# Patient Record
Sex: Female | Born: 1990 | Race: Black or African American | Hispanic: No | Marital: Single | Smoking: Never smoker
Health system: Southern US, Community
[De-identification: ages and names within clinical notes are randomized; demographics above are authoritative.]

## PROBLEM LIST (undated history)

## (undated) DIAGNOSIS — J45909 Unspecified asthma, uncomplicated: Secondary | ICD-10-CM

## (undated) DIAGNOSIS — K8020 Calculus of gallbladder without cholecystitis without obstruction: Secondary | ICD-10-CM

## (undated) HISTORY — PX: KNEE ARTHROSCOPY W/ MEDIAL COLLATERAL LIGAMENT (MCL) REPAIR: SHX1876

## (undated) HISTORY — PX: ANTERIOR CRUCIATE LIGAMENT REPAIR: SHX115

---
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
## 2012-08-05 ENCOUNTER — Encounter (HOSPITAL_COMMUNITY): Payer: Self-pay | Admitting: Emergency Medicine

## 2012-08-05 ENCOUNTER — Emergency Department (HOSPITAL_COMMUNITY): Payer: BC Managed Care – PPO

## 2012-08-05 ENCOUNTER — Emergency Department (HOSPITAL_COMMUNITY)
Admission: EM | Admit: 2012-08-05 | Discharge: 2012-08-05 | Disposition: A | Discharge status: Home / Self Care | Payer: BC Managed Care – PPO | Attending: Emergency Medicine | Admitting: Emergency Medicine

## 2012-08-05 DIAGNOSIS — X500XXA Overexertion from strenuous movement or load, initial encounter: Secondary | ICD-10-CM | POA: Insufficient documentation

## 2012-08-05 DIAGNOSIS — S92309A Fracture of unspecified metatarsal bone(s), unspecified foot, initial encounter for closed fracture: Secondary | ICD-10-CM | POA: Insufficient documentation

## 2012-08-05 DIAGNOSIS — M2540 Effusion, unspecified joint: Secondary | ICD-10-CM | POA: Insufficient documentation

## 2012-08-05 DIAGNOSIS — Y939 Activity, unspecified: Secondary | ICD-10-CM | POA: Insufficient documentation

## 2012-08-05 DIAGNOSIS — S92351S Displaced fracture of fifth metatarsal bone, right foot, sequela: Secondary | ICD-10-CM

## 2012-08-05 DIAGNOSIS — W172XXA Fall into hole, initial encounter: Secondary | ICD-10-CM | POA: Insufficient documentation

## 2012-08-05 DIAGNOSIS — S92209A Fracture of unspecified tarsal bone(s) of unspecified foot, initial encounter for closed fracture: Secondary | ICD-10-CM | POA: Insufficient documentation

## 2012-08-05 DIAGNOSIS — Y929 Unspecified place or not applicable: Secondary | ICD-10-CM | POA: Insufficient documentation

## 2012-08-05 MED ORDER — TRAMADOL HCL 50 MG PO TABS: 50.0000 mg | ORAL_TABLET | Freq: Four times a day (QID) | ORAL | Status: DC | PRN

## 2012-08-05 MED ORDER — IBUPROFEN 800 MG PO TABS: 800.0000 mg | ORAL_TABLET | Freq: Three times a day (TID) | ORAL | Status: DC

## 2012-08-05 NOTE — ED Provider Notes (Signed)
Medical screening examination/treatment/procedure(s) were performed by non-physician practitioner and as supervising physician I was immediately available for consultation/collaboration.   Carleene Cooper III, MD 08/05/12 [phone]

## 2012-08-05 NOTE — ED Provider Notes (Signed)
History     CSN: [id number]  Arrival date & time 08/05/12  1056   First MD Initiated Contact with Patient 08/05/12 1133      Chief Complaint  Patient presents with  . Ankle Pain    (Consider location/radiation/quality/duration/timing/severity/associated sxs/prior treatment) HPI Comments: Pt presents to ED after falling into a pot hole and twisted her ankle last night.  She fell onto the lateral side of her right ankle and heard a crunching sound.  Went straight home and elevated her foot.  Has not been taking pain medications or icing her foot.  No prior injury to the ankle.  Denies any numbness, tingling, or loss of sensation into the right foot.  Is not able to bear weight.  Patient is a 22 y.o. female presenting with ankle pain. The history is provided by the patient.  Ankle Pain   History reviewed. No pertinent past medical history.  History reviewed. No pertinent past surgical history.  History reviewed. No pertinent family history.  History  Substance Use Topics  . Smoking status: Never Smoker   . Smokeless tobacco: Not on file  . Alcohol Use: Yes    OB History   Grav Para Term Preterm Abortions TAB SAB Ect Mult Living                  Review of Systems  Musculoskeletal: Positive for joint swelling.  All other systems reviewed and are negative.    Allergies  Review of patient's allergies indicates no known allergies.  Home Medications  No current outpatient prescriptions on file.  BP 129/75  Pulse 92  Temp(Src) 98.3 F (36.8 C) (Oral)  Resp 18  SpO2 99%  Physical Exam  Nursing note and vitals reviewed. Constitutional: She is oriented to person, place, and time. She appears well-developed and well-nourished.  HENT:  Head: Normocephalic and atraumatic.  Eyes: EOM are normal.  Neck: Normal range of motion.  Cardiovascular: Normal rate, regular rhythm and normal heart sounds.   Pulmonary/Chest: Effort normal and breath sounds normal.   Musculoskeletal:       Right ankle: She exhibits decreased range of motion (due to pain) and swelling. She exhibits no ecchymosis, no deformity, no laceration and normal pulse. Achilles tendon normal.  Maximum tenderness at base of 5th metatarsal; swelling along lateral right foot.  Normal sensation throughout  Neurological: She is alert and oriented to person, place, and time.  Skin: Skin is warm and dry.    ED Course  Procedures (including critical care time)  Labs Reviewed - No data to display Dg Ankle Complete Right  08/05/2012  *RADIOLOGY REPORT*  Clinical Data: Injured right ankle.  RIGHT ANKLE - COMPLETE 3+ VIEW  Comparison: None  Findings: The ankle mortise is maintained.  No acute ankle fracture or osteochondral lesion.  Multiple rounded densities between the talus and the lateral malleolus likely due to previous avulsion injuries.  The visualized mid and hind foot bony structures are intact. Acute versus chronic the avulsion injury at the base of the fifth metatarsal.  IMPRESSION:  1.  No acute ankle fracture. 2.  Acute versus chronic small avulsion fracture at the base of the fifth metatarsal.  Recommend clinical correlation.   Original Report Authenticated By: Rudie Meyer, M.D.      1. Jones fracture, right, sequela       MDM  12:16 PM Pt evaluated.  Small avulsion fx at base of 5th metatarsal, most likely acute.  Given post-op shoe, crutches and pain  meds. Given instructions to follow up with PCP if sx are not improving in the next few days.  Note given for modified duty at work to allow the foot to rest.  [employer] given if additional services are needed.  Return to ED for new or worsening symptoms.        Garlon Hatchet, PA-C 08/05/12 1428

## 2012-08-05 NOTE — Progress Notes (Signed)
Orthopedic Tech Progress Note Patient Details:  [place] Sprague 06/11/1991 [id number]  Ortho Devices Type of Ortho Device: Postop shoe/boot;Crutches Ortho Device/Splint Location: RIGHT POST OP SHOE AND CRUTCHES 5.10'' Ortho Device/Splint Interventions: Application   Cammer, Mickie Bail 08/05/2012, 12:46 PM

## 2012-08-05 NOTE — ED Notes (Signed)
Pt c/o right ankle pain after falling and twisting ankle last night

## 2013-07-23 ENCOUNTER — Emergency Department (HOSPITAL_COMMUNITY)
Admission: EM | Admit: 2013-07-23 | Discharge: 2013-07-24 | Disposition: A | Discharge status: Home / Self Care | Payer: BC Managed Care – PPO | Attending: Emergency Medicine | Admitting: Emergency Medicine

## 2013-07-23 ENCOUNTER — Encounter (HOSPITAL_COMMUNITY): Payer: Self-pay | Admitting: Emergency Medicine

## 2013-07-23 DIAGNOSIS — R5383 Other fatigue: Secondary | ICD-10-CM

## 2013-07-23 DIAGNOSIS — R112 Nausea with vomiting, unspecified: Secondary | ICD-10-CM | POA: Insufficient documentation

## 2013-07-23 DIAGNOSIS — R824 Acetonuria: Secondary | ICD-10-CM

## 2013-07-23 DIAGNOSIS — R6889 Other general symptoms and signs: Secondary | ICD-10-CM

## 2013-07-23 DIAGNOSIS — R5381 Other malaise: Secondary | ICD-10-CM | POA: Insufficient documentation

## 2013-07-23 DIAGNOSIS — K5900 Constipation, unspecified: Secondary | ICD-10-CM

## 2013-07-23 DIAGNOSIS — R3919 Other difficulties with micturition: Secondary | ICD-10-CM | POA: Insufficient documentation

## 2013-07-23 DIAGNOSIS — R6883 Chills (without fever): Secondary | ICD-10-CM | POA: Insufficient documentation

## 2013-07-23 DIAGNOSIS — R079 Chest pain, unspecified: Secondary | ICD-10-CM | POA: Insufficient documentation

## 2013-07-23 MED ORDER — ONDANSETRON 8 MG PO TBDP
8.0000 mg | ORAL_TABLET | Freq: Once | ORAL | Status: AC
  Administered 2013-07-23: 8 mg via ORAL
  Filled 2013-07-23: qty 1

## 2013-07-23 MED ORDER — BISACODYL 5 MG PO TBEC
5.0000 mg | DELAYED_RELEASE_TABLET | Freq: Every day | ORAL | Status: DC | PRN
  Administered 2013-07-23: 5 mg via ORAL
  Filled 2013-07-23: qty 1

## 2013-07-23 NOTE — ED Notes (Signed)
Lab called stating urine had been mislabeled. Pt aware of need for another urine sample.

## 2013-07-23 NOTE — ED Notes (Signed)
Bed: [id number] Expected date:  Expected time:  Means of arrival:  Comments: EMS gen weakness

## 2013-07-23 NOTE — ED Notes (Signed)
Pt reports has not had BM for at least a week. She reports has only been eating 1 meal a day, hasn't had much appetite. Pt says she has been drinking water. C/o dark colored urine, denies dysuria or any other urinary sx.

## 2013-07-23 NOTE — ED Notes (Signed)
Per EMS: Pt reports decreased appetite and constipation x 1 wk. Pt has been drinking water, not eating solid foods. Pt reports increased anxiety recently.

## 2013-07-23 NOTE — ED Provider Notes (Signed)
CSN: [id number]     Arrival date & time 07/23/13  2102 History   First MD Initiated Contact with Patient 07/23/13 2119     Chief Complaint  Patient presents with  . Fatigue  . Constipation   (Consider location/radiation/quality/duration/timing/severity/associated sxs/prior Treatment) HPI Patient states she took a long walk outside today in the cold and came home and was shivering and having a hard time warming up.  States she drank warm apple cider and vomited.  She does currently have nausea.  Denies abdominal pain.  States she has had many problems since October and November of 2014- states she is intermittently constipated and is constipated currently.  States her urine is dark and malodorous, she has had this checked by her doctor and was told it was fine, and her appetite has been decreased.  She has also been binding her breasts and whenever she wears the binder she has tightness and pain in her chest during and after she wears the binder.    History reviewed. No pertinent past medical history. Past Surgical History  Procedure Laterality Date  . Anterior cruciate ligament repair Right    No family history on file. History  Substance Use Topics  . Smoking status: Never Smoker   . Smokeless tobacco: Not on file  . Alcohol Use: Yes     Comment: 2-3 beers and couple shots every weekend   OB History   Grav Para Term Preterm Abortions TAB SAB Ect Mult Living                 Review of Systems  Constitutional: Positive for chills. Negative for fever.  Respiratory: Negative for cough and shortness of breath.   Cardiovascular: Negative for chest pain.  Gastrointestinal: Positive for nausea and vomiting. Negative for abdominal pain and diarrhea.  Genitourinary: Negative for dysuria, urgency, frequency, vaginal bleeding, vaginal discharge and menstrual problem.  All other systems reviewed and are negative.    Allergies  Review of patient's allergies indicates no known  allergies.  Home Medications   Current Outpatient Rx  Name  Route  Sig  Dispense  Refill  . ibuprofen (ADVIL,MOTRIN) 800 MG tablet   Oral   Take 1 tablet (800 mg total) by mouth 3 (three) times daily.   21 tablet   0   . traMADol (ULTRAM) 50 MG tablet   Oral   Take 1 tablet (50 mg total) by mouth every 6 (six) hours as needed for pain.   20 tablet   0    BP 135/91  Pulse 112  Temp(Src) 98.7 F (37.1 C) (Oral)  Resp 18  Ht 5\' 8"  (1.727 m)  Wt 215 lb (97.523 kg)  BMI 32.70 kg/m2  SpO2 100%  LMP 07/17/2013 Physical Exam  Nursing note and vitals reviewed. Constitutional: She appears well-developed and well-nourished. No distress.  HENT:  Head: Normocephalic and atraumatic.  Neck: Neck supple.  Cardiovascular: Normal rate and regular rhythm.   Pulmonary/Chest: Effort normal and breath sounds normal. No respiratory distress. She has no wheezes. She has no rales. She exhibits tenderness (chest pain reproducible to palpation).  Abdominal: Soft. She exhibits no distension and no mass. There is no tenderness. There is no rebound and no guarding.  Genitourinary:  Pt declined rectal exam  Neurological: She is alert.  Skin: She is not diaphoretic.    ED Course  Procedures (including critical care time) Labs Review Labs Reviewed  URINALYSIS, ROUTINE W REFLEX MICROSCOPIC - Abnormal; Notable for the following:  Ketones, ur >80 (*)    All other components within normal limits   Imaging Review No results found.  EKG Interpretation   None      Filed Vitals:   07/24/13 0005  BP: 108/64  Pulse: 84  Temp:   Resp: 16    Pt tolerating PO fluids.   MDM   1. Sensation of feeling cold   2. Ketonuria   3. Constipation     Afebrile, nontoxic pt with multiple chronic concerns including intermittent constipation x months and malodorous urine x months, today also with chills after walking around in the cold weather.  She had not eaten all day and was emotionally upset  because of "friendship problems."  Vomited x 1.  ODT zofran, laxative (patient requested to take while in ED) given.  UA positive for ketones but without infection. Pt feeling warmer while in ED.   Pt declined rectal exam and I could therefore not officially rule out fecal impaction; however, patient has had chronic intermittent constipation for several months and this week without BM is not unusual for her.  She is having no rectal or abdominal pain.  I doubt SBO.   Discussed result, findings, treatment, and follow up  with patient.  Pt given return precautions.  Pt verbalizes understanding and agrees with plan.        [place], PA-C 07/24/13 [phone]

## 2013-07-23 NOTE — ED Notes (Signed)
Pt aware of need for urine sample. Pt states unable to urinate at this time.

## 2013-07-24 LAB — URINALYSIS, ROUTINE W REFLEX MICROSCOPIC
BILIRUBIN URINE: NEGATIVE
Glucose, UA: NEGATIVE mg/dL
Hgb urine dipstick: NEGATIVE
Leukocytes, UA: NEGATIVE
NITRITE: NEGATIVE
PH: 5.5 (ref 5.0–8.0)
Protein, ur: NEGATIVE mg/dL
Specific Gravity, Urine: 1.015 (ref 1.005–1.030)
Urobilinogen, UA: 1 mg/dL (ref 0.0–1.0)

## 2013-07-24 NOTE — ED Provider Notes (Signed)
Medical screening examination/treatment/procedure(s) were performed by non-physician practitioner and as supervising physician I was immediately available for consultation/collaboration.  EKG Interpretation   None         Chantilly Linskey M Billy Turvey, DO 07/24/13 1352 

## 2013-07-24 NOTE — Discharge Instructions (Signed)
Read the information below.  You may return to the Emergency Department at any time for worsening condition or any new symptoms that concern you.   Please see your primary care provider for a follow up appointment.  If you develop worsening constipation, are unable to pass gas, develop abdominal pain or uncontrolled vomiting, return to the ER immediately for a recheck.  Please drink plenty of fluids and eat a healthy fiber-filled diet.     Constipation, Adult Constipation is when a person has fewer than 3 bowel movements a week; has difficulty having a bowel movement; or has stools that are dry, hard, or larger than normal. As people grow older, constipation is more common. If you try to fix constipation with medicines that make you have a bowel movement (laxatives), the problem may get worse. Long-term laxative use may cause the muscles of the colon to become weak. A low-fiber diet, not taking in enough fluids, and taking certain medicines may make constipation worse. CAUSES   Certain medicines, such as antidepressants, pain medicine, iron supplements, antacids, and water pills.   Certain diseases, such as diabetes, irritable bowel syndrome (IBS), thyroid disease, or depression.   Not drinking enough water.   Not eating enough fiber-rich foods.   Stress or travel.  Lack of physical activity or exercise.  Not going to the restroom when there is the urge to have a bowel movement.  Ignoring the urge to have a bowel movement.  Using laxatives too much. SYMPTOMS   Having fewer than 3 bowel movements a week.   Straining to have a bowel movement.   Having hard, dry, or larger than normal stools.   Feeling full or bloated.   Pain in the lower abdomen.  Not feeling relief after having a bowel movement. DIAGNOSIS  Your caregiver will take a medical history and perform a physical exam. Further testing may be done for severe constipation. Some tests may include:   A barium enema  X-ray to examine your rectum, colon, and sometimes, your small intestine.  A sigmoidoscopy to examine your lower colon.  A colonoscopy to examine your entire colon. TREATMENT  Treatment will depend on the severity of your constipation and what is causing it. Some dietary treatments include drinking more fluids and eating more fiber-rich foods. Lifestyle treatments may include regular exercise. If these diet and lifestyle recommendations do not help, your caregiver may recommend taking over-the-counter laxative medicines to help you have bowel movements. Prescription medicines may be prescribed if over-the-counter medicines do not work.  HOME CARE INSTRUCTIONS   Increase dietary fiber in your diet, such as fruits, vegetables, whole grains, and beans. Limit high-fat and processed sugars in your diet, such as [place] fries, hamburgers, cookies, candies, and soda.   A fiber supplement may be added to your diet if you cannot get enough fiber from foods.   Drink enough fluids to keep your urine clear or pale yellow.   Exercise regularly or as directed by your caregiver.   Go to the restroom when you have the urge to go. Do not hold it.  Only take medicines as directed by your caregiver. Do not take other medicines for constipation without talking to your caregiver first. SEEK IMMEDIATE MEDICAL CARE IF:   You have bright red blood in your stool.   Your constipation lasts for more than 4 days or gets worse.   You have abdominal or rectal pain.   You have thin, pencil-like stools.  You have unexplained weight loss.  MAKE SURE YOU:   Understand these instructions.  Will watch your condition.  Will get help right away if you are not doing well or get worse. Document Released: 03/02/2004 Document Revised: 08/27/2011 Document Reviewed: 03/16/2013 [facility], [facility], [facility] Patient Information 2014 [place], [place].   Fiber Content in Foods Drinking plenty of fluids and consuming foods high in fiber  can help with constipation. See the list below for the fiber content of some common foods. Starches and Grains / Dietary Fiber (g)  Cheerios, 1 cup / 3 g  Kellogg's Corn Flakes, 1 cup / 0.7 g  Rice Krispies, 1  cup / 0.3 g  Quaker Oat Life Cereal,  cup / 2.1 g  Oatmeal, instant (cooked),  cup / 2 g  Kellogg's Frosted Mini Wheats, 1 cup / 5.1 g  Rice, brown, long-grain (cooked), 1 cup / 3.5 g  Rice, white, long-grain (cooked), 1 cup / 0.6 g  Macaroni, cooked, enriched, 1 cup / 2.5 g Legumes / Dietary Fiber (g)  Beans, baked, canned, plain or vegetarian,  cup / 5.2 g  Beans, kidney, canned,  cup / 6.8 g  Beans, pinto, dried (cooked),  cup / 7.7 g  Beans, pinto, canned,  cup / 5.5 g Breads and Crackers / Dietary Fiber (g)  Graham crackers, plain or honey, 2 squares / 0.7 g  Saltine crackers, 3 squares / 0.3 g  Pretzels, plain, salted, 10 pieces / 1.8 g  Bread, whole-wheat, 1 slice / 1.9 g  Bread, white, 1 slice / 0.7 g  Bread, raisin, 1 slice / 1.2 g  Bagel, plain, 3 oz / 2 g  Tortilla, flour, 1 oz / 0.9 g  Tortilla, corn, 1 small / 1.5 g  Bun, hamburger or hotdog, 1 small / 0.9 g Fruits / Dietary Fiber (g)  Apple, raw with skin, 1 medium / 4.4 g  Applesauce, sweetened,  cup / 1.5 g  Banana,  medium / 1.5 g  Grapes, 10 grapes / 0.4 g  Orange, 1 small / 2.3 g  Raisin, 1.5 oz / 1.6 g  Melon, 1 cup / 1.4 g Vegetables / Dietary Fiber (g)  Green beans, canned,  cup / 1.3 g  Carrots (cooked),  cup / 2.3 g  Broccoli (cooked),  cup / 2.8 g  Peas, frozen (cooked),  cup / 4.4 g  Potatoes, mashed,  cup / 1.6 g  Lettuce, 1 cup / 0.5 g  Corn, canned,  cup / 1.6 g  Tomato,  cup / 1.1 g Document Released: 10/21/2006 Document Revised: 08/27/2011 Document Reviewed: 12/16/2006 ExitCare Patient Information 2014 [place], [place].

## 2014-02-13 ENCOUNTER — Emergency Department (HOSPITAL_COMMUNITY)
Admission: EM | Admit: 2014-02-13 | Discharge: 2014-02-13 | Disposition: A | Discharge status: Home / Self Care | Payer: BC Managed Care – PPO | Attending: Emergency Medicine | Admitting: Emergency Medicine

## 2014-02-13 ENCOUNTER — Emergency Department (HOSPITAL_COMMUNITY): Payer: BC Managed Care – PPO

## 2014-02-13 ENCOUNTER — Encounter (HOSPITAL_COMMUNITY): Payer: Self-pay | Admitting: Emergency Medicine

## 2014-02-13 DIAGNOSIS — Z79899 Other long term (current) drug therapy: Secondary | ICD-10-CM | POA: Insufficient documentation

## 2014-02-13 DIAGNOSIS — R1013 Epigastric pain: Secondary | ICD-10-CM | POA: Diagnosis present

## 2014-02-13 DIAGNOSIS — J45909 Unspecified asthma, uncomplicated: Secondary | ICD-10-CM | POA: Insufficient documentation

## 2014-02-13 DIAGNOSIS — R1011 Right upper quadrant pain: Secondary | ICD-10-CM | POA: Diagnosis not present

## 2014-02-13 DIAGNOSIS — K8020 Calculus of gallbladder without cholecystitis without obstruction: Secondary | ICD-10-CM | POA: Diagnosis not present

## 2014-02-13 DIAGNOSIS — Z3202 Encounter for pregnancy test, result negative: Secondary | ICD-10-CM | POA: Diagnosis not present

## 2014-02-13 HISTORY — DX: Unspecified asthma, uncomplicated: J45.909

## 2014-02-13 LAB — CBC WITH DIFFERENTIAL/PLATELET
BASOS PCT: 0 % (ref 0–1)
Basophils Absolute: 0 10*3/uL (ref 0.0–0.1)
EOS ABS: 0.1 10*3/uL (ref 0.0–0.7)
EOS PCT: 2 % (ref 0–5)
HCT: 41.6 % (ref 36.0–46.0)
HEMOGLOBIN: 15.1 g/dL — AB (ref 12.0–15.0)
Lymphocytes Relative: 44 % (ref 12–46)
Lymphs Abs: 3 10*3/uL (ref 0.7–4.0)
MCH: 31.5 pg (ref 26.0–34.0)
MCHC: 36.3 g/dL — AB (ref 30.0–36.0)
MCV: 86.7 fL (ref 78.0–100.0)
MONO ABS: 0.5 10*3/uL (ref 0.1–1.0)
MONOS PCT: 8 % (ref 3–12)
NEUTROS PCT: 46 % (ref 43–77)
Neutro Abs: 3.2 10*3/uL (ref 1.7–7.7)
Platelets: 332 10*3/uL (ref 150–400)
RBC: 4.8 MIL/uL (ref 3.87–5.11)
RDW: 12.2 % (ref 11.5–15.5)
WBC: 7 10*3/uL (ref 4.0–10.5)

## 2014-02-13 LAB — LIPASE, BLOOD: LIPASE: 41 U/L (ref 11–59)

## 2014-02-13 LAB — URINALYSIS, ROUTINE W REFLEX MICROSCOPIC
BILIRUBIN URINE: NEGATIVE
Glucose, UA: NEGATIVE mg/dL
HGB URINE DIPSTICK: NEGATIVE
KETONES UR: NEGATIVE mg/dL
Leukocytes, UA: NEGATIVE
NITRITE: NEGATIVE
Protein, ur: NEGATIVE mg/dL
Specific Gravity, Urine: 1.014 (ref 1.005–1.030)
UROBILINOGEN UA: 0.2 mg/dL (ref 0.0–1.0)
pH: 8 (ref 5.0–8.0)

## 2014-02-13 LAB — COMPREHENSIVE METABOLIC PANEL
ALBUMIN: 4.2 g/dL (ref 3.5–5.2)
ALT: 16 U/L (ref 0–35)
ANION GAP: 13 (ref 5–15)
AST: 20 U/L (ref 0–37)
Alkaline Phosphatase: 67 U/L (ref 39–117)
BUN: 12 mg/dL (ref 6–23)
CALCIUM: 9.4 mg/dL (ref 8.4–10.5)
CO2: 22 mEq/L (ref 19–32)
CREATININE: 0.72 mg/dL (ref 0.50–1.10)
Chloride: 104 mEq/L (ref 96–112)
GFR calc Af Amer: >90 mL/min (ref >90)
GFR calc non Af Amer: >90 mL/min (ref >90)
Glucose, Bld: 97 mg/dL (ref 70–99)
POTASSIUM: 3.4 meq/L — AB (ref 3.7–5.3)
Sodium: 139 mEq/L (ref 137–147)
TOTAL PROTEIN: 7.8 g/dL (ref 6.0–8.3)
Total Bilirubin: <0.2 mg/dL — ABNORMAL LOW (ref 0.3–1.2)

## 2014-02-13 LAB — I-STAT TROPONIN, ED: TROPONIN I, POC: 0 ng/mL (ref 0.00–0.08)

## 2014-02-13 LAB — POC URINE PREG, ED: PREG TEST UR: NEGATIVE

## 2014-02-13 MED ORDER — OXYCODONE-ACETAMINOPHEN 5-325 MG PO TABS: 1.0000 | ORAL_TABLET | Freq: Three times a day (TID) | ORAL | Status: DC | PRN

## 2014-02-13 MED ORDER — ONDANSETRON HCL 4 MG PO TABS: 4.0000 mg | ORAL_TABLET | Freq: Four times a day (QID) | ORAL | Status: AC

## 2014-02-13 MED ORDER — POTASSIUM CHLORIDE CRYS ER 20 MEQ PO TBCR
40.0000 meq | EXTENDED_RELEASE_TABLET | Freq: Once | ORAL | Status: AC
  Administered 2014-02-13: 40 meq via ORAL
  Filled 2014-02-13: qty 2

## 2014-02-13 MED ORDER — SODIUM CHLORIDE 0.9 % IV BOLUS (SEPSIS)
1000.0000 mL | Freq: Once | INTRAVENOUS | Status: AC
  Administered 2014-02-13: 1000 mL via INTRAVENOUS

## 2014-02-13 MED ORDER — FENTANYL CITRATE 0.05 MG/ML IJ SOLN
50.0000 ug | Freq: Once | INTRAMUSCULAR | Status: AC
  Administered 2014-02-13: 50 ug via INTRAVENOUS
  Filled 2014-02-13: qty 2

## 2014-02-13 MED ORDER — ONDANSETRON HCL 4 MG/2ML IJ SOLN
4.0000 mg | Freq: Once | INTRAMUSCULAR | Status: AC
  Administered 2014-02-13: 4 mg via INTRAVENOUS
  Filled 2014-02-13: qty 2

## 2014-02-13 MED ORDER — MORPHINE SULFATE 4 MG/ML IJ SOLN
4.0000 mg | Freq: Once | INTRAMUSCULAR | Status: AC
  Administered 2014-02-13: 4 mg via INTRAVENOUS
  Filled 2014-02-13: qty 1

## 2014-02-13 NOTE — Discharge Instructions (Signed)
Please call your doctor for a followup appointment within 24-48 hours. When you talk to your doctor please let them know that you were seen in the emergency department and have them acquire all of your records so that they can discuss the findings with you and formulate a treatment plan to fully care for your new and ongoing problems. Please call and set-up an appointment with General Surgery - will need a HIDA scan as an outpatient and need to be followed regarding gallstones for this can lead to gallbladder infection  Please rest and stay hydrated Please avoid fatty greasy foods for this leads to increase working of the gallbladder and formation of stones Please take medications as prescribed - while on pain medications there is to be no drinking alcohol, driving, operating any heavy machinery. If extra please dispose in a proper manner. Please do not take any extra Tylenol with this medication for this can lead to Tylenol overdose and liver issues.  Please drink plenty of water  Please continue to monitor symptoms closely and if symptoms are to worsen or change (fever greater than 101, chills, sweating, nausea, vomiting, chest pain, shortness of breathe, difficulty breathing, weakness, numbness, tingling, worsening or changes to pain pattern, blood in the stools, black tarry stools, worsening pain to the right upper quadrant of the abdomen, inability to keep any food or fluids down) please report back to the Emergency Department immediately.   Cholelithiasis Cholelithiasis (also called gallstones) is a form of gallbladder disease in which gallstones form in your gallbladder. The gallbladder is an organ that stores bile made in the liver, which helps digest fats. Gallstones begin as small crystals and slowly grow into stones. Gallstone pain occurs when the gallbladder spasms and a gallstone is blocking the duct. Pain can also occur when a stone passes out of the duct.  RISK FACTORS  Being female.    Having multiple pregnancies. Health care providers sometimes advise removing diseased gallbladders before future pregnancies.   Being obese.  Eating a diet heavy in fried foods and fat.   Being older than 60 years and increasing age.   Prolonged use of medicines containing female hormones.   Having diabetes mellitus.   Rapidly losing weight.   Having a family history of gallstones (heredity).  SYMPTOMS  Nausea.   Vomiting.  Abdominal pain.   Yellowing of the skin (jaundice).   Sudden pain. It may persist from several minutes to several hours.  Fever.   Tenderness to the touch. In some cases, when gallstones do not move into the bile duct, people have no pain or symptoms. These are called "silent" gallstones.  TREATMENT Silent gallstones do not need treatment. In severe cases, emergency surgery may be required. Options for treatment include:  Surgery to remove the gallbladder. This is the most common treatment.  Medicines. These do not always work and may take 6-12 months or more to work.  Shock wave treatment (extracorporeal biliary lithotripsy). In this treatment an ultrasound machine sends shock waves to the gallbladder to break gallstones into smaller pieces that can pass into the intestines or be dissolved by medicine. HOME CARE INSTRUCTIONS   Only take over-the-counter or prescription medicines for pain, discomfort, or fever as directed by your health care provider.   Follow a low-fat diet until seen again by your health care provider. Fat causes the gallbladder to contract, which can result in pain.   Follow up with your health care provider as directed. Attacks are almost always recurrent  and surgery is usually required for permanent treatment.  SEEK IMMEDIATE MEDICAL CARE IF:   Your pain increases and is not controlled by medicines.   You have a fever or persistent symptoms for more than 2-3 days.   You have a fever and your symptoms  suddenly get worse.   You have persistent nausea and vomiting.  MAKE SURE YOU:   Understand these instructions.  Will watch your condition.  Will get help right away if you are not doing well or get worse. Document Released: 05/31/2005 Document Revised: 02/04/2013 Document Reviewed: 11/26/2012 [facility] Patient Information 2015 [place], [place]. This information is not intended to replace advice given to you by your health care provider. Make sure you discuss any questions you have with your health care provider.

## 2014-02-13 NOTE — ED Notes (Signed)
Pt declines wheelchair.

## 2014-02-13 NOTE — ED Provider Notes (Signed)
CSN: [id number]     Arrival date & time 02/13/14  0534 History   First MD Initiated Contact with Patient 02/13/14 [phone]     Chief Complaint  Patient presents with  . Abdominal Pain     (Consider location/radiation/quality/duration/timing/severity/associated sxs/prior Treatment) The history is provided by the patient. No language interpreter was used.  [place] Abbs is a 23 year old female with past medical history of asthma presenting to the ED with epigastric abdominal pain that started approximate 3:00 AM this morning. Patient reports that the pain is localized epigastric region with mild radiation towards her left side described as a burning, sharp pain that has been constant. Reports that he feels as if she needs to burp, stated that when she did per the pain was not relieved. Stated that when she has increase in sharp pain it hurts to take a deep breath in. Reported that at approximately midnight last night patient ate half of her remaining [employer] sandwich. Patient reported that yesterday she ate cheesy nachos and a salad. Reported that at approximately 3:00 AM this morning showed associated nausea with the abdominal pain-reported that she had a bowel movement it was mainly very small. LMP 02/06/2014. Denied vomiting, diarrhea, melena, hematochezia, chest pain, shortness of breath, difficulty breathing, back pain, dysuria, hematuria, fever, chills, sweating. PCP in [facility]  Past Medical History  Diagnosis Date  . Asthma    Past Surgical History  Procedure Laterality Date  . Anterior cruciate ligament repair Right    History reviewed. No pertinent family history. History  Substance Use Topics  . Smoking status: Never Smoker   . Smokeless tobacco: Not on file  . Alcohol Use: Yes     Comment: 2-3 beers and couple shots every weekend   OB History   Grav Para Term Preterm Abortions TAB SAB Ect Mult Living                 Review of Systems  Constitutional:  Negative for fever and chills.  Respiratory: Negative for chest tightness and shortness of breath.   Cardiovascular: Negative for chest pain.  Gastrointestinal: Positive for nausea and abdominal pain. Negative for vomiting, diarrhea, constipation, blood in stool and anal bleeding.  Genitourinary: Negative for dysuria, hematuria, vaginal bleeding, vaginal discharge and vaginal pain.  Musculoskeletal: Negative for back pain.      Allergies  Review of patient's allergies indicates no known allergies.  Home Medications   Prior to Admission medications   Medication Sig Start Date End Date Taking? Authorizing Provider  glycerin adult (GLYCERIN ADULT) 2 G SUPP Place 1 suppository rectally once as needed for moderate constipation.   Yes Historical Provider, MD  ondansetron (ZOFRAN) 4 MG tablet Take 1 tablet (4 mg total) by mouth every 6 (six) hours. 02/13/14   Sonji Starkes, PA-C  oxyCODONE-acetaminophen (PERCOCET/ROXICET) 5-325 MG per tablet Take 1 tablet by mouth every 8 (eight) hours as needed for moderate pain or severe pain. 02/13/14   Arion Morgan, PA-C   BP 113/73  Pulse 86  Temp(Src) 97.4 F (36.3 C) (Oral)  Resp 20  Ht  (1.727 m)  Wt 230 lb (104.327 kg)  BMI 34.98 kg/m2  SpO2 94%  LMP 02/06/2014 Physical Exam  Nursing note and vitals reviewed. Constitutional: She is oriented to person, place, and time. She appears well-developed and well-nourished. No distress.  HENT:  Head: Normocephalic and atraumatic.  Mouth/Throat: Oropharynx is clear and moist. No oropharyngeal exudate.  Eyes: Conjunctivae and EOM are normal. Pupils are  equal, round, and reactive to light. Right eye exhibits no discharge. Left eye exhibits no discharge.  Neck: Normal range of motion. Neck supple. No tracheal deviation present.  Cardiovascular: Normal rate, regular rhythm and normal heart sounds.   Pulses:      Radial pulses are 2+ on the right side, and 2+ on the left side.  Cap refill less  than 3 seconds  Pulmonary/Chest: Effort normal and breath sounds normal. No respiratory distress. She has no wheezes. She has no rales. She exhibits no tenderness.  Patient is able to speak in full sentences without difficulty Negative use of accessory muscles Negative stridor Negative pain upon palpation to the chest wall  Abdominal: Soft. Normal appearance and bowel sounds are normal. She exhibits no distension. There is tenderness in the epigastric area. There is no rebound and no guarding.    Obese Bowel sounds normoactive in all 4 quadrants Abdomen soft upon palpation Discomfort upon palpation to the epigastric and right upper quadrant Negative peritoneal signs Negative rigidity or guarding noted  Musculoskeletal: Normal range of motion.  Full ROM to upper and lower extremities without difficulty noted, negative ataxia noted.  Lymphadenopathy:    She has no cervical adenopathy.  Neurological: She is alert and oriented to person, place, and time. She exhibits normal muscle tone. Coordination normal.  Cranial nerves III-XII grossly intact Strength 5+/5+ to upper extremities bilaterally with resistance applied, equal distribution noted Equal grip strength bilaterally  Skin: Skin is warm and dry. No rash noted. She is not diaphoretic. No erythema.  Psychiatric: She has a normal mood and affect. Her behavior is normal. Thought content normal.    ED Course  Procedures (including critical care time)  10:28 AM This provider re-assessed the patient. Reported that her pain is resolved. Stated that her pain is a 10/10.   Results for orders placed during the hospital encounter of 02/13/14  CBC WITH DIFFERENTIAL      Result Value Ref Range   WBC 7.0  4.0 - 10.5 K/uL   RBC 4.80  3.87 - 5.11 MIL/uL   Hemoglobin 15.1 (*) 12.0 - 15.0 g/dL   HCT 78.2  95.6 - 21.3 %   MCV 86.7  78.0 - 100.0 fL   MCH 31.5  26.0 - 34.0 pg   MCHC 36.3 (*) 30.0 - 36.0 g/dL   RDW 08.6  57.8 - 46.9 %    Platelets 332  150 - 400 K/uL   Neutrophils Relative % 46  43 - 77 %   Neutro Abs 3.2  1.7 - 7.7 K/uL   Lymphocytes Relative 44  12 - 46 %   Lymphs Abs 3.0  0.7 - 4.0 K/uL   Monocytes Relative 8  3 - 12 %   Monocytes Absolute 0.5  0.1 - 1.0 K/uL   Eosinophils Relative 2  0 - 5 %   Eosinophils Absolute 0.1  0.0 - 0.7 K/uL   Basophils Relative 0  0 - 1 %   Basophils Absolute 0.0  0.0 - 0.1 K/uL  COMPREHENSIVE METABOLIC PANEL      Result Value Ref Range   Sodium 139  137 - 147 mEq/L   Potassium 3.4 (*) 3.7 - 5.3 mEq/L   Chloride 104  96 - 112 mEq/L   CO2 22  19 - 32 mEq/L   Glucose, Bld 97  70 - 99 mg/dL   BUN 12  6 - 23 mg/dL   Creatinine, Ser 6.29  0.50 - 1.10 mg/dL  Calcium 9.4  8.4 - 10.5 mg/dL   Total Protein 7.8  6.0 - 8.3 g/dL   Albumin 4.2  3.5 - 5.2 g/dL   AST 20  0 - 37 U/L   ALT 16  0 - 35 U/L   Alkaline Phosphatase 67  39 - 117 U/L   Total Bilirubin <0.2 (*) 0.3 - 1.2 mg/dL   GFR calc non Af Amer >90  >90 mL/min   GFR calc Af Amer >90  >90 mL/min   Anion gap 13  5 - 15  LIPASE, BLOOD      Result Value Ref Range   Lipase 41  11 - 59 U/L  URINALYSIS, ROUTINE W REFLEX MICROSCOPIC      Result Value Ref Range   Color, Urine YELLOW  YELLOW   APPearance CLEAR  CLEAR   Specific Gravity, Urine 1.014  1.005 - 1.030   pH 8.0  5.0 - 8.0   Glucose, UA NEGATIVE  NEGATIVE mg/dL   Hgb urine dipstick NEGATIVE  NEGATIVE   Bilirubin Urine NEGATIVE  NEGATIVE   Ketones, ur NEGATIVE  NEGATIVE mg/dL   Protein, ur NEGATIVE  NEGATIVE mg/dL   Urobilinogen, UA 0.2  0.0 - 1.0 mg/dL   Nitrite NEGATIVE  NEGATIVE   Leukocytes, UA NEGATIVE  NEGATIVE  I-STAT TROPOININ, ED      Result Value Ref Range   Troponin i, poc 0.00  0.00 - 0.08 ng/mL   Comment 3           POC URINE PREG, ED      Result Value Ref Range   Preg Test, Ur NEGATIVE  NEGATIVE  '  Labs Review Labs Reviewed  CBC WITH DIFFERENTIAL - Abnormal; Notable for the following:    Hemoglobin 15.1 (*)    MCHC 36.3 (*)    All  other components within normal limits  COMPREHENSIVE METABOLIC PANEL - Abnormal; Notable for the following:    Potassium 3.4 (*)    Total Bilirubin <0.2 (*)    All other components within normal limits  LIPASE, BLOOD  URINALYSIS, ROUTINE W REFLEX MICROSCOPIC  I-STAT TROPOININ, ED  POC URINE PREG, ED    Imaging Review US Abdomen Complete  02/13/2014   CLINICAL DATA:  Pain.  Nausea .  EXAM: ULTRASOUND ABDOMEN COMPLETE  COMPARISON:  None.  FINDINGS: Gallbladder:  Gallstones. The largest measures 1 cm. Gallbladder wall thickness normal appear no pericholecystic fluid collection. Negative Murphy's sign  Common bile duct:  Diameter: 5.0 mm  Liver:  No focal lesion identified. Within normal limits in parenchymal echogenicity.  IVC:  No abnormality visualized.  Pancreas:  Visualized portion unremarkable.  Spleen:  Size and appearance within normal limits.  Right Kidney:  Length: 10.5 cm. Echogenicity within normal limits. No mass or hydronephrosis visualized.  Left Kidney:  Length: 11.0 cm. Echogenicity within normal limits. No mass or hydronephrosis visualized.  Abdominal aorta:  No aneurysm visualized.  Other findings:  None.  IMPRESSION: Gallstones. No evidence of cholecystitis. No evidence of biliary distention .   Electronically Signed   By: Maisie Fus  Register   On: 02/13/2014 09:06     EKG Interpretation None      MDM   Final diagnoses:  Gallstones  RUQ pain    Medications  ondansetron (ZOFRAN) injection 4 mg (4 mg Intravenous Given 02/13/14 0600)  fentaNYL (SUBLIMAZE) injection 50 mcg (50 mcg Intravenous Given 02/13/14 0600)  sodium chloride 0.9 % bolus 1,000 mL (0 mLs Intravenous Stopped 02/13/14 0716)  morphine 4 MG/ML injection 4 mg (4 mg Intravenous Given 02/13/14 0819)  potassium chloride SA (K-DUR,KLOR-CON) CR tablet 40 mEq (40 mEq Oral Given 02/13/14 1104)   Filed Vitals:   02/13/14 0945 02/13/14 1015 02/13/14 1045 02/13/14 1054  BP: 105/56 102/62 113/76 113/73  Pulse: 64 94 76  86  Temp:    97.4 F (36.3 C)  TempSrc:    Oral  Resp: Height:      Weight:      SpO2: 100% 100% 100% 94%   EKG noted normal sinus rhythm with a heart rate of 69 beats per minute. Troponin negative elevation. CBC negative elevated white blood cell count-negative left shift. CMP unremarkable. Lipase negative elevation. Urine pregnancy negative. Urinalysis negative for infection. Abdominal ultrasound identified gallstones, gallbladder, measuring approximately 1 cm. Gallbladder wall thickness normal with no pericholecystic fluid collection. Negative Murphy sign. Labs reassuring. Ultrasound identified gallstones. Negative findings of acute cholecystitis. Doubt pancreatitis. Doubt acute inflammatory process. Patient stable, afebrile. Patient not septic appearing. Patient tolerated fluids by mouth with negative episodes of emesis while in ED setting. Pain controlled while in ED setting. Discharged patient. Discharge patient with pain medications and antiemetics-discussed course, precautions, disposal technique. Referred to general surgery and PCP-patient will most likely need a HIDA scan as an outpatient. Discussed with patient proper diet. Discussed with patient to closely monitor symptoms and if symptoms are to worsen or change to report back to the ED - strict return instructions given.  Patient agreed to plan of care, understood, all questions answered.   Raymon Mutton, PA-C 02/13/14 1757

## 2014-02-13 NOTE — ED Notes (Signed)
Pt reports epigastric pain starting about an hour ago. Pt states that the pain is burning and feels like GERD. Pt with no hx of GERD. Pt states she took a laxative around 0500. Pt ambulatory to room.

## 2014-02-14 NOTE — ED Provider Notes (Signed)
Medical screening examination/treatment/procedure(s) were performed by non-physician practitioner and as supervising physician I was immediately available for consultation/collaboration.   EKG Interpretation None        Tomasita Crumble, MD 02/14/14 1722

## 2014-10-19 ENCOUNTER — Encounter (HOSPITAL_COMMUNITY): Payer: Self-pay

## 2014-10-19 ENCOUNTER — Emergency Department (HOSPITAL_COMMUNITY)
Admission: EM | Admit: 2014-10-19 | Discharge: 2014-10-19 | Disposition: A | Discharge status: Home / Self Care | Payer: Managed Care, Other (non HMO) | Attending: Emergency Medicine | Admitting: Emergency Medicine

## 2014-10-19 ENCOUNTER — Emergency Department (HOSPITAL_COMMUNITY): Payer: Managed Care, Other (non HMO)

## 2014-10-19 DIAGNOSIS — S93401A Sprain of unspecified ligament of right ankle, initial encounter: Secondary | ICD-10-CM | POA: Diagnosis not present

## 2014-10-19 DIAGNOSIS — W108XXA Fall (on) (from) other stairs and steps, initial encounter: Secondary | ICD-10-CM | POA: Diagnosis not present

## 2014-10-19 DIAGNOSIS — S99911A Unspecified injury of right ankle, initial encounter: Secondary | ICD-10-CM | POA: Diagnosis present

## 2014-10-19 DIAGNOSIS — Z8719 Personal history of other diseases of the digestive system: Secondary | ICD-10-CM | POA: Diagnosis not present

## 2014-10-19 DIAGNOSIS — Y9301 Activity, walking, marching and hiking: Secondary | ICD-10-CM | POA: Insufficient documentation

## 2014-10-19 DIAGNOSIS — Y9289 Other specified places as the place of occurrence of the external cause: Secondary | ICD-10-CM | POA: Insufficient documentation

## 2014-10-19 DIAGNOSIS — J45909 Unspecified asthma, uncomplicated: Secondary | ICD-10-CM | POA: Insufficient documentation

## 2014-10-19 DIAGNOSIS — Y998 Other external cause status: Secondary | ICD-10-CM | POA: Diagnosis not present

## 2014-10-19 HISTORY — DX: Calculus of gallbladder without cholecystitis without obstruction: K80.20

## 2014-10-19 MED ORDER — HYDROCODONE-ACETAMINOPHEN 5-325 MG PO TABS: 1.0000 | ORAL_TABLET | Freq: Four times a day (QID) | ORAL | Status: DC | PRN

## 2014-10-19 MED ORDER — IBUPROFEN 200 MG PO TABS
600.0000 mg | ORAL_TABLET | Freq: Once | ORAL | Status: AC
  Administered 2014-10-19: 600 mg via ORAL
  Filled 2014-10-19: qty 3

## 2014-10-19 MED ORDER — IBUPROFEN 600 MG PO TABS: 600.0000 mg | ORAL_TABLET | Freq: Once | ORAL | Status: AC

## 2014-10-19 MED ORDER — HYDROCODONE-ACETAMINOPHEN 5-325 MG PO TABS
1.0000 | ORAL_TABLET | Freq: Once | ORAL | Status: AC
Start: 2014-10-19 — End: 2014-10-19
  Administered 2014-10-19: 1 via ORAL
  Filled 2014-10-19: qty 1

## 2014-10-19 NOTE — ED Provider Notes (Signed)
CSN: [id number]     Arrival date & time 10/19/14  0139 History   First MD Initiated Contact with Patient 10/19/14 0344     Chief Complaint  Patient presents with  . Ankle Injury     (Consider location/radiation/quality/duration/timing/severity/associated sxs/prior Treatment) HPI Comments: Patient states she was walking and missed a step, fell, twisting her right ankle.  She has a history of having a previous right ankle fracture.  Do not take any medication.  Prior to arrival for discomfort  Patient is a 24 y.o. female presenting with lower extremity injury. The history is provided by the patient.  Ankle Injury This is a new problem. The current episode started today. The problem occurs constantly. Associated symptoms include arthralgias and joint swelling. Pertinent negatives include no fever or weakness. The symptoms are aggravated by walking. She has tried nothing for the symptoms. The treatment provided no relief.    Past Medical History  Diagnosis Date  . Asthma   . Gallstones    Past Surgical History  Procedure Laterality Date  . Anterior cruciate ligament repair Right   . Knee arthroscopy w/ medial collateral ligament (mcl) repair     No family history on file. History  Substance Use Topics  . Smoking status: Never Smoker   . Smokeless tobacco: Not on file  . Alcohol Use: Yes     Comment: 2-3 beers and couple shots every weekend   OB History    No data available     Review of Systems  Constitutional: Negative for fever.  Respiratory: Negative for shortness of breath.   Musculoskeletal: Positive for joint swelling and arthralgias.  Skin: Negative for wound.  Neurological: Negative for weakness.  All other systems reviewed and are negative.     Allergies  Review of patient's allergies indicates no known allergies.  Home Medications   Prior to Admission medications   Medication Sig Start Date End Date Taking? Authorizing Provider  HYDROcodone-acetaminophen  (NORCO/VICODIN) 5-325 MG per tablet Take 1 tablet by mouth every 6 (six) hours as needed for moderate pain. 10/19/14   Earley FavorGail Marlene Pfluger, NP  ibuprofen (ADVIL,MOTRIN) 600 MG tablet Take 1 tablet (600 mg total) by mouth once. 10/19/14   Earley FavorGail Demetries Coia, NP  ondansetron (ZOFRAN) 4 MG tablet Take 1 tablet (4 mg total) by mouth every 6 (six) hours. Patient not taking: Reported on 10/19/2014 02/13/14   Marissa Sciacca, PA-C   BP 147/132 mmHg  Pulse 98  Temp(Src) 97.8 F (36.6 C) (Oral)  Resp 18  SpO2 100%  LMP 08/16/2014 (Exact Date) Physical Exam  Constitutional: She is oriented to person, place, and time. She appears well-developed and well-nourished.  HENT:  Head: Normocephalic.  Eyes: Pupils are equal, round, and reactive to light.  Neck: Normal range of motion.  Cardiovascular: Normal rate and regular rhythm.   Pulmonary/Chest: Effort normal and breath sounds normal.  Musculoskeletal: She exhibits tenderness. She exhibits no edema.       Right ankle: She exhibits decreased range of motion and swelling. She exhibits no deformity, no laceration and normal pulse. Tenderness.  Neurological: She is alert and oriented to person, place, and time.  Skin: Skin is warm. No erythema.  Nursing note and vitals reviewed.   ED Course  Procedures (including critical care time) Labs Review Labs Reviewed - No data to display  Imaging Review Dg Ankle Complete Right  10/19/2014   CLINICAL DATA:  Larey SeatFell down stairs at home. Lateral malleolar swelling.  EXAM: RIGHT ANKLE - COMPLETE 3+  VIEW  COMPARISON:  08/05/2012  FINDINGS: There are old fragments at the fibular tip. There is old fragment at the fifth metatarsal base. There is no acute fracture. Mortise is symmetric.  IMPRESSION: Negative for acute fracture.   Electronically Signed   By: Ellery Plunk M.D.   On: 10/19/2014 02:38     EKG Interpretation None     X-ray has been reviewed.  There is no fracture.  Patient will be placed in an ASO and crutches and pain  management.  [place] area[place]  She does have a relationship with an orthopedic physician that she will see when she arrives home MDM   Final diagnoses:  Ankle sprain, right, initial encounter         Earley Favor, NP 10/19/14 0401  April Palumbo, MD 10/19/14 [phone]

## 2014-10-19 NOTE — ED Notes (Signed)
Patient reports she was running out of her house, missed a step, and fell.  She has abrasions to left knee, but reports worst pain is in right ankle.

## 2014-10-19 NOTE — Discharge Instructions (Signed)

## 2015-01-28 ENCOUNTER — Emergency Department (HOSPITAL_COMMUNITY)
Admission: EM | Admit: 2015-01-28 | Discharge: 2015-01-28 | Disposition: A | Discharge status: Home / Self Care | Payer: Managed Care, Other (non HMO) | Attending: Emergency Medicine | Admitting: Emergency Medicine

## 2015-01-28 ENCOUNTER — Encounter (HOSPITAL_COMMUNITY): Payer: Self-pay | Admitting: Emergency Medicine

## 2015-01-28 DIAGNOSIS — J45909 Unspecified asthma, uncomplicated: Secondary | ICD-10-CM | POA: Diagnosis not present

## 2015-01-28 DIAGNOSIS — L03012 Cellulitis of left finger: Secondary | ICD-10-CM | POA: Diagnosis not present

## 2015-01-28 DIAGNOSIS — Z8719 Personal history of other diseases of the digestive system: Secondary | ICD-10-CM | POA: Insufficient documentation

## 2015-01-28 DIAGNOSIS — M79645 Pain in left finger(s): Secondary | ICD-10-CM | POA: Diagnosis present

## 2015-01-28 MED ORDER — SULFAMETHOXAZOLE-TRIMETHOPRIM 800-160 MG PO TABS: 1.0000 | ORAL_TABLET | Freq: Two times a day (BID) | ORAL | Status: AC

## 2015-01-28 MED ORDER — LIDOCAINE HCL (PF) 1 % IJ SOLN
5.0000 mL | Freq: Once | INTRAMUSCULAR | Status: DC
  Filled 2015-01-28: qty 5

## 2015-01-28 MED ORDER — BUPIVACAINE HCL 0.25 % IJ SOLN: 10.0000 mL | Freq: Once | INTRAMUSCULAR | Status: DC

## 2015-01-28 MED ORDER — HYDROCODONE-ACETAMINOPHEN 5-325 MG PO TABS: 1.0000 | ORAL_TABLET | Freq: Four times a day (QID) | ORAL | Status: AC | PRN

## 2015-01-28 MED ORDER — CEPHALEXIN 500 MG PO CAPS: 500.0000 mg | ORAL_CAPSULE | Freq: Four times a day (QID) | ORAL | Status: AC

## 2015-01-28 NOTE — ED Provider Notes (Signed)
CSN: [id number]     Arrival date & time 01/28/15  1747 History   First MD Initiated Contact with Patient 01/28/15 1818     Chief Complaint  Patient presents with  . Finger Injury     (Consider location/radiation/quality/duration/timing/severity/associated sxs/prior Treatment) Patient is a 24 y.o. female presenting with hand pain. The history is provided by the patient.  Hand Pain This is a new problem. The current episode started 1 to 4 weeks ago. The problem occurs constantly. The problem has been gradually worsening. She has tried nothing for the symptoms.   Caitlin Turner is a 24 y.o. female who presents to the ED with pain and swelling to the left index finger. The pain started 2 weeks ago after she had a hangnail. She denies fever or chills or other problems. States she has had similar problem in the past that required I&D. Past Medical History  Diagnosis Date  . Asthma   . Gallstones    Past Surgical History  Procedure Laterality Date  . Anterior cruciate ligament repair Right   . Knee arthroscopy w/ medial collateral ligament (mcl) repair     No family history on file. Social History  Substance Use Topics  . Smoking status: Never Smoker   . Smokeless tobacco: None  . Alcohol Use: Yes     Comment: 2-3 beers and couple shots every weekend   OB History    No data available     Review of Systems Negative except as stated in HPI   Allergies  Review of patient's allergies indicates no known allergies.  Home Medications   Prior to Admission medications   Medication Sig Start Date End Date Taking? Authorizing Provider  cephALEXin (KEFLEX) 500 MG capsule Take 1 capsule (500 mg total) by mouth 4 (four) times daily. 01/28/15   Keilin Gamboa Orlene Och, NP  HYDROcodone-acetaminophen (NORCO) 5-325 MG per tablet Take 1 tablet by mouth every 6 (six) hours as needed. 01/28/15   Lakendra Helling Orlene Och, NP  ibuprofen (ADVIL,MOTRIN) 600 MG tablet Take 1 tablet (600 mg total) by mouth once. 10/19/14    Earley Favor, NP  ondansetron (ZOFRAN) 4 MG tablet Take 1 tablet (4 mg total) by mouth every 6 (six) hours. Patient not taking: Reported on 10/19/2014 02/13/14   Marissa Sciacca, PA-C  sulfamethoxazole-trimethoprim (BACTRIM DS,SEPTRA DS) 800-160 MG per tablet Take 1 tablet by mouth 2 (two) times daily. 01/28/15 02/04/15  Terre Hanneman Orlene Och, NP   BP 114/62 mmHg  Pulse 64  Temp(Src) 98.1 F (36.7 C) (Oral)  Resp 18  SpO2 100% Physical Exam  Constitutional: She is oriented to person, place, and time. She appears well-developed and well-nourished.  HENT:  Head: Normocephalic.  Eyes: EOM are normal.  Neck: Neck supple.  Cardiovascular: Normal rate.   Pulmonary/Chest: Effort normal.  Musculoskeletal: Normal range of motion.       Hands: Tenderness, swelling and infection noted to the area surrounding the nail of the left index finger.   Neurological: She is alert and oriented to person, place, and time. No cranial nerve deficit.  Skin: Skin is warm and dry.  Psychiatric: She has a normal mood and affect. Her behavior is normal.  Nursing note and vitals reviewed.   ED Course  Procedures (including critical care time) Digital Block using Sensorcaine 0.5% and Lidocaine 1% without epi.  Finger soaked in betadine and NSS Incision made with #11 blade at the edge of the nail  Small amount of purulent bloody drainage expelled Probed with hemostat  Irrigated with NSS Dressing applied Patient tolerated procedure without any immediate complications.  Keflex and Bactrim started.   Labs Review Labs Reviewed - No data to display  Imaging Review.   MDM  24 y.o. female with swelling and tenderness to the left index finger x 2 weeks. Stable for d/c and will return in 2 days for recheck or sooner for any problems.   Final diagnoses:  Paronychia of finger, left       [facility] NP 01/28/15 2340  Caitlin Mo, MD 02/01/15 2356

## 2015-01-28 NOTE — ED Notes (Signed)
Pt reports that she had an infected finer on her left hand

## 2015-01-28 NOTE — Discharge Instructions (Signed)
Soak the finger in warm water with antibacterial soap. Take the antibiotics as directed. Do not take the narcotic pain medication if driving as it will make you sleepy. Return in 2 days for recheck or sooner for problems.   Paronychia Paronychia is an inflammatory reaction involving the folds of the skin surrounding the fingernail. This is commonly caused by an infection in the skin around a nail. The most common cause of paronychia is frequent wetting of the hands (as seen with bartenders, food servers, nurses or others who wet their hands). This makes the skin around the fingernail susceptible to infection by bacteria (germs) or fungus. Other predisposing factors are:  Aggressive manicuring.  Nail biting.  Thumb sucking. The most common cause is a staphylococcal (a type of germ) infection, or a fungal (Candida) infection. When caused by a germ, it usually comes on suddenly with redness, swelling, pus and is often painful. It may get under the nail and form an abscess (collection of pus), or form an abscess around the nail. If the nail itself is infected with a fungus, the treatment is usually prolonged and may require oral medicine for up to one year. Your caregiver will determine the length of time treatment is required. The paronychia caused by bacteria (germs) may largely be avoided by not pulling on hangnails or picking at cuticles. When the infection occurs at the tips of the finger it is called felon. When the cause of paronychia is from the herpes simplex virus (HSV) it is called herpetic whitlow. TREATMENT  When an abscess is present treatment is often incision and drainage. This means that the abscess must be cut open so the pus can get out. When this is done, the following home care instructions should be followed. HOME CARE INSTRUCTIONS   It is important to keep the affected fingers very dry. Rubber or plastic gloves over cotton gloves should be used whenever the hand must be placed in  water.  Keep wound clean, dry and dressed as suggested by your caregiver between warm soaks or warm compresses.  Soak in warm water for fifteen to twenty minutes three to four times per day for bacterial infections. Fungal infections are very difficult to treat, so often require treatment for long periods of time.  For bacterial (germ) infections take antibiotics (medicine which kill germs) as directed and finish the prescription, even if the problem appears to be solved before the medicine is gone.  Only take over-the-counter or prescription medicines for pain, discomfort, or fever as directed by your caregiver. SEEK IMMEDIATE MEDICAL CARE IF:  You have redness, swelling, or increasing pain in the wound.  You notice pus coming from the wound.  You have a fever.  You notice a bad smell coming from the wound or dressing. Document Released: 11/28/2000 Document Revised: 08/27/2011 Document Reviewed: 07/30/2008 [facility] Patient Information 2015 [place], [place]. This information is not intended to replace advice given to you by your health care provider. Make sure you discuss any questions you have with your health care provider.

## 2015-09-09 IMAGING — CR DG ANKLE COMPLETE 3+V*R*
3 series · 3 of 3 positions shown · non-contrast
Comparison: 08/05/2012

CLINICAL DATA: Fell down stairs at home. Lateral malleolar
swelling.

EXAM:
RIGHT ANKLE - COMPLETE 3+ VIEW

[x ankle ap right]
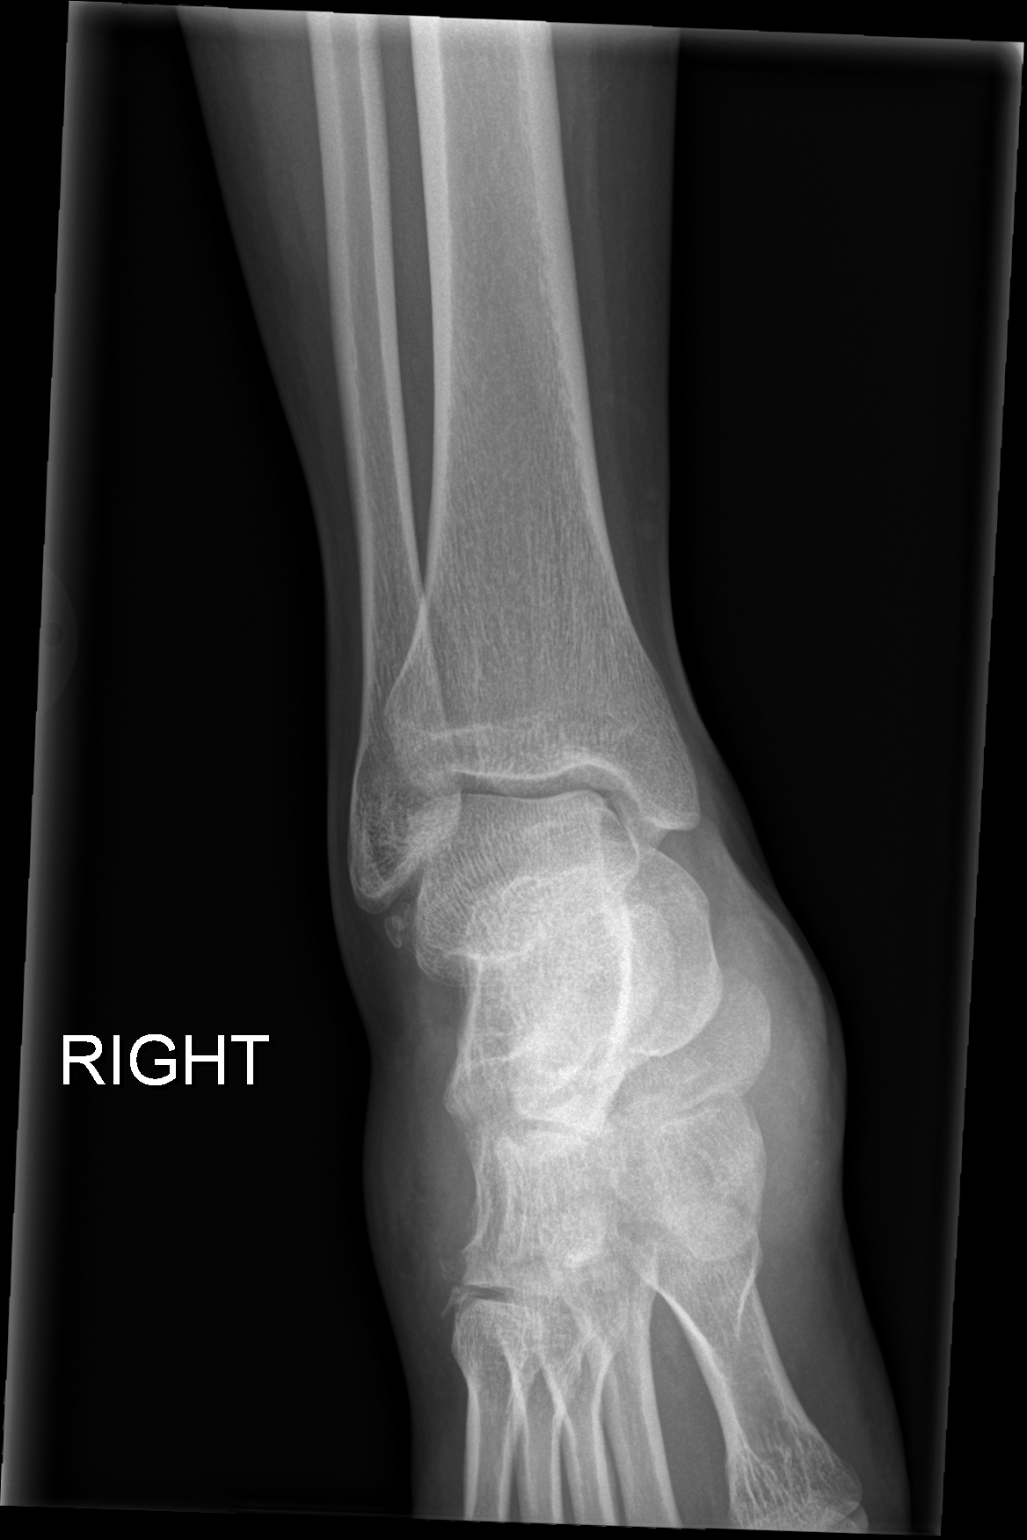

[x ankle obl right]
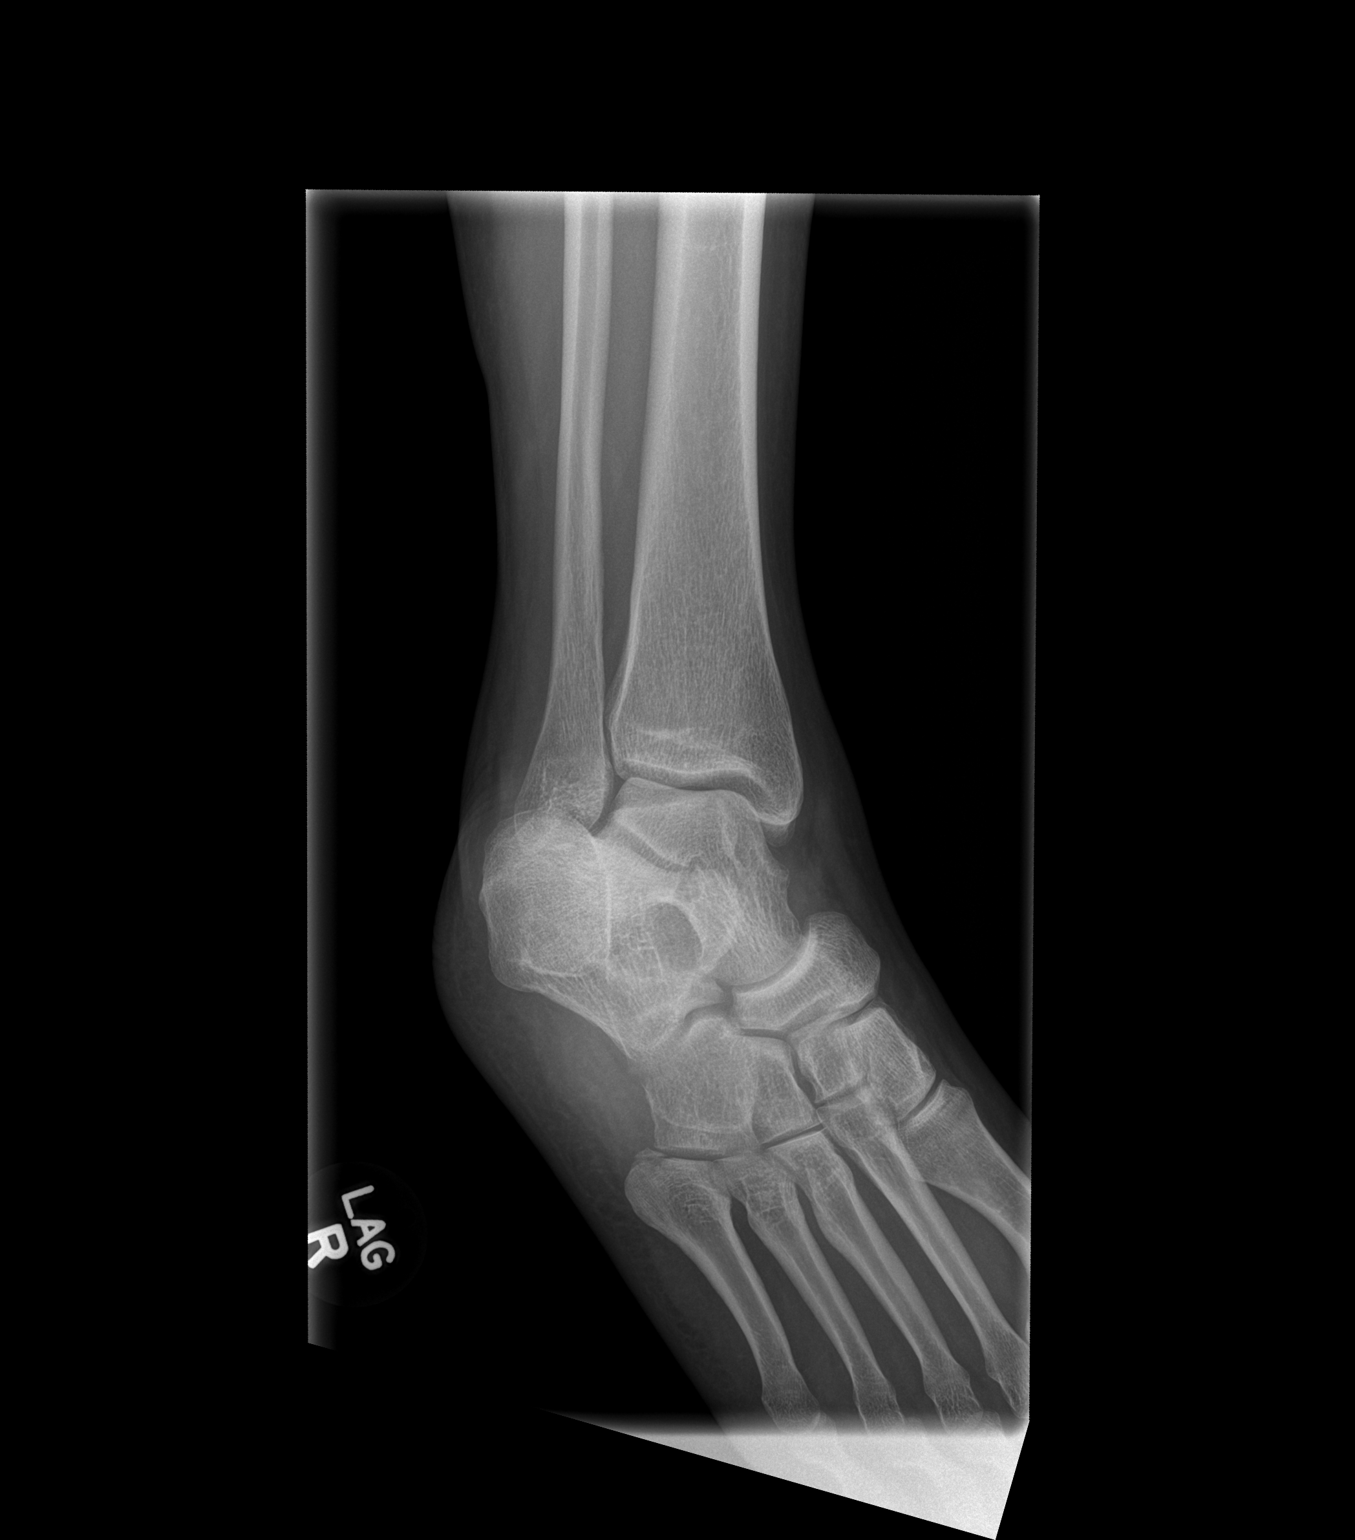

[x ankle lat right]
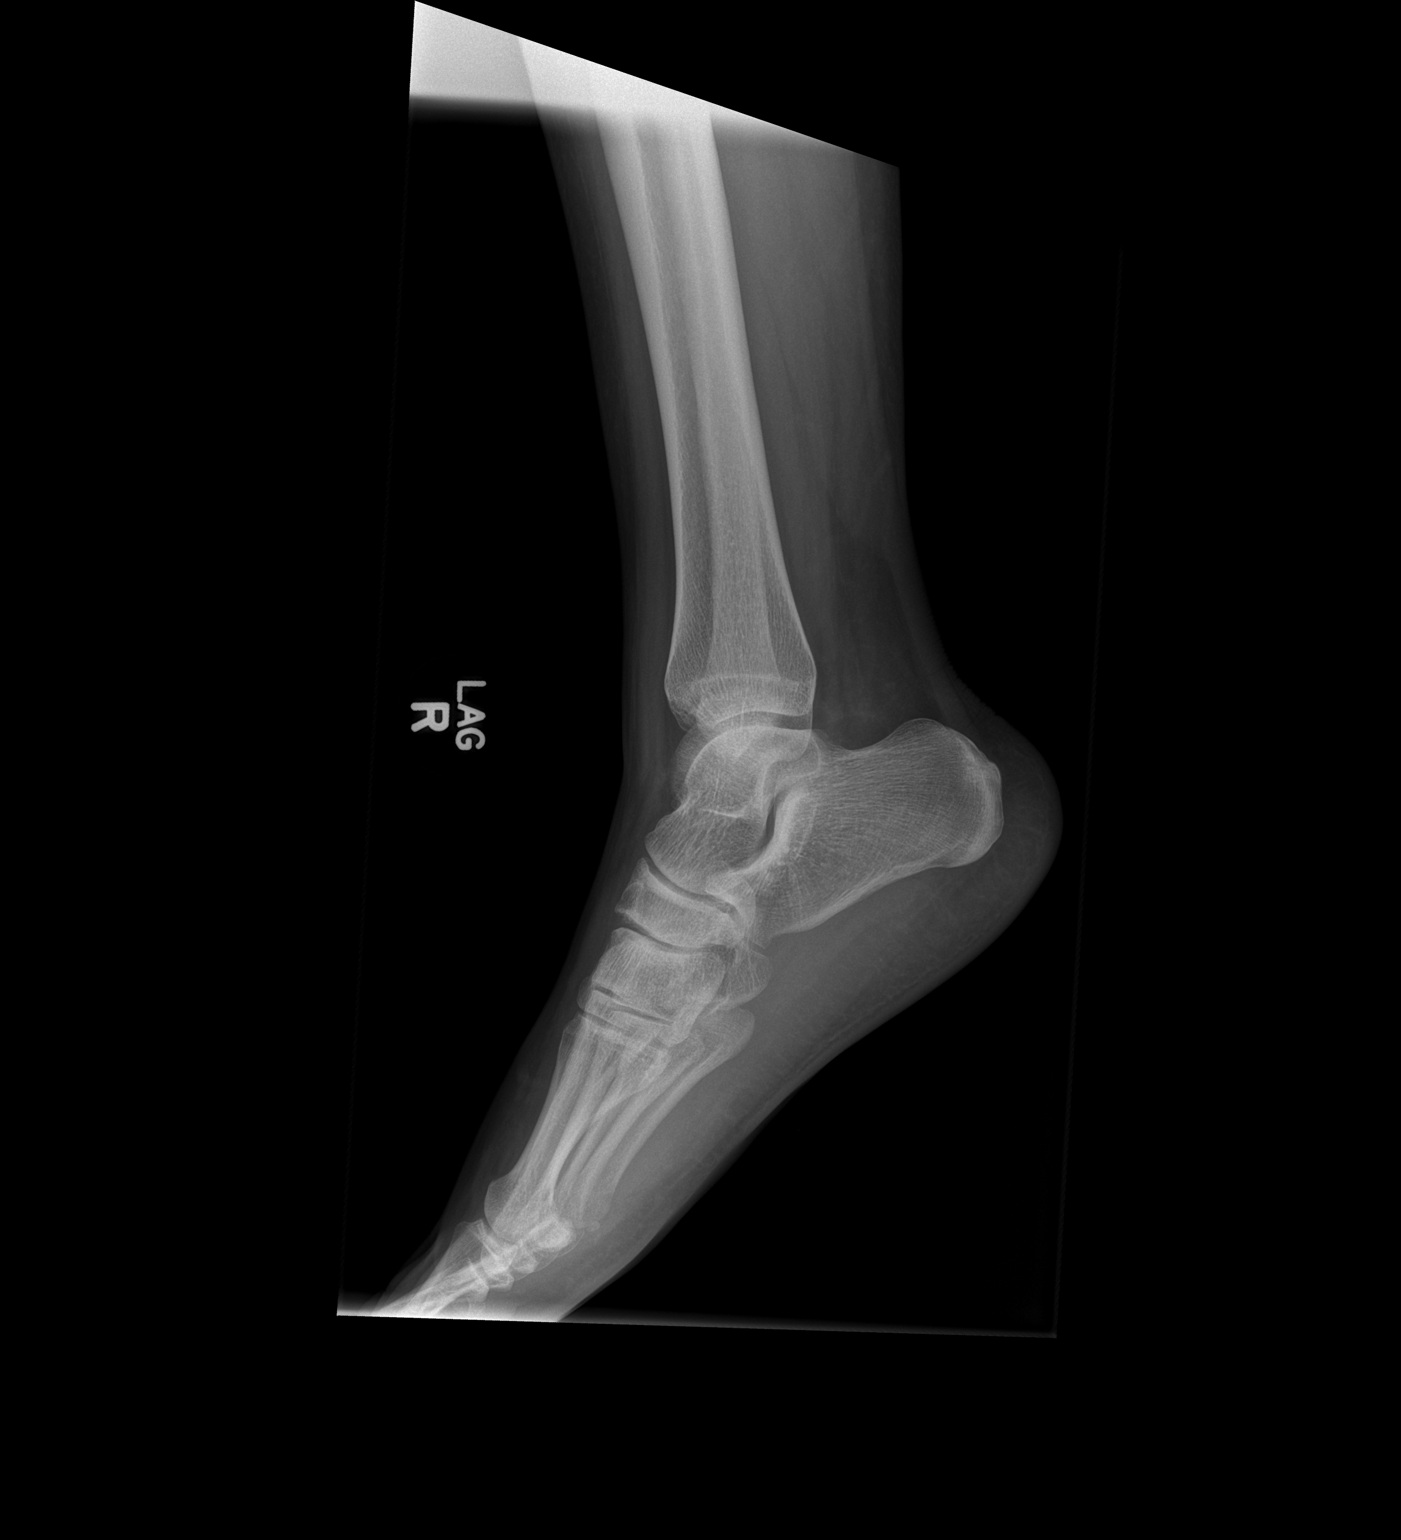

[3 of 3 positions shown; findings below may reference images not displayed]

FINDINGS: There are old fragments at the fibular tip. There is old fragment at
the fifth metatarsal base. There is no acute fracture. Mortise is
symmetric.
IMPRESSION: Negative for acute fracture.

## 2017-09-27 ENCOUNTER — Ambulatory Visit: Payer: Self-pay | Admitting: Family Medicine

## 2018-12-14 ENCOUNTER — Emergency Department (HOSPITAL_COMMUNITY): Payer: BLUE CROSS/BLUE SHIELD

## 2018-12-14 ENCOUNTER — Emergency Department (HOSPITAL_COMMUNITY)
Admission: EM | Admit: 2018-12-14 | Discharge: 2018-12-14 | Disposition: A | Discharge status: Home / Self Care | Payer: BLUE CROSS/BLUE SHIELD | Attending: Emergency Medicine | Admitting: Emergency Medicine

## 2018-12-14 ENCOUNTER — Other Ambulatory Visit: Payer: Self-pay

## 2018-12-14 DIAGNOSIS — J45909 Unspecified asthma, uncomplicated: Secondary | ICD-10-CM | POA: Diagnosis not present

## 2018-12-14 DIAGNOSIS — F1210 Cannabis abuse, uncomplicated: Secondary | ICD-10-CM | POA: Insufficient documentation

## 2018-12-14 DIAGNOSIS — M79671 Pain in right foot: Secondary | ICD-10-CM | POA: Diagnosis present

## 2018-12-14 NOTE — Discharge Instructions (Signed)
Follow-up with PCP for continued have pain.  X-rays were negative for fracture dislocation.  Make sure to ice, elevate the extremity.  May take Tylenol and ibuprofen for pain.

## 2018-12-14 NOTE — ED Triage Notes (Signed)
Patient hit his foot against a heavy wood door yesterday morning. He soaked his foot in epsom salt yesterday and the pain did decrease a little bit. Can still ambulate but with unsteady gait.

## 2018-12-14 NOTE — ED Provider Notes (Signed)
[facility] EMERGENCY DEPARTMENT Provider Note   CSN: [id number] Arrival date & time: 12/14/18  1214    History   Chief Complaint Chief Complaint  Patient presents with   Foot Injury    HPI Caitlin Turner is a 28 y.o. female who presents for evaluation of foot pain.  Patient states 3 days ago she was walking into her room and hit her digit to her right foot on a heavy dresser.  Patient states she has had pain located to digits 2 through 4 since the incident.  Has been ambulatory prior to evaluation.  Denies fever, chills, nausea, vomiting, decreased range of motion, numbness or tingling, redness, swelling, redness, warmth, lacerations, contusions or abrasions.  Taking Tylenol with moderate relief of pain.  Current pain is a 4/10.  Denies radiation of pain.  Denies any pain to ankle, foot, calcaneus, tibia/fibula.  Denies additional aggravating or alleviating factors.  History obtained from patient and past medical records.  No interpreter is used.    HPI  Past Medical History:  Diagnosis Date   Asthma    Gallstones     There are no active problems to display for this patient.   Past Surgical History:  Procedure Laterality Date   ANTERIOR CRUCIATE LIGAMENT REPAIR Right    KNEE ARTHROSCOPY W/ MEDIAL COLLATERAL LIGAMENT (MCL) REPAIR       OB History   No obstetric history on file.      Home Medications    Prior to Admission medications   Medication Sig Start Date End Date Taking? Authorizing Provider  cephALEXin (KEFLEX) 500 MG capsule Take 1 capsule (500 mg total) by mouth 4 (four) times daily. 01/28/15   Ashley Murrain, NP  HYDROcodone-acetaminophen (NORCO) 5-325 MG per tablet Take 1 tablet by mouth every 6 (six) hours as needed. 01/28/15   Ashley Murrain, NP  ibuprofen (ADVIL,MOTRIN) 600 MG tablet Take 1 tablet (600 mg total) by mouth once. 10/19/14   Junius Creamer, NP  ondansetron (ZOFRAN) 4 MG tablet Take 1 tablet (4 mg total) by mouth every 6  (six) hours. Patient not taking: Reported on 10/19/2014 02/13/14   Jamse Mead, PA-C    Family History No family history on file.  Social History Social History   Tobacco Use   Smoking status: Never Smoker  Substance Use Topics   Alcohol use: Yes    Comment: 2-3 beers and couple shots every weekend   Drug use: Yes    Types: Marijuana     Allergies   Patient has no known allergies.   Review of Systems Review of Systems  Constitutional: Negative.   HENT: Negative.   Respiratory: Negative.   Cardiovascular: Negative.   Gastrointestinal: Negative.   Genitourinary: Negative.   Musculoskeletal: Negative for arthralgias, back pain, gait problem, joint swelling, myalgias, neck pain and neck stiffness.       Pain to digits 2 through 4 on right lower extremity  Skin: Negative.   Neurological: Negative.   All other systems reviewed and are negative.    Physical Exam Updated Vital Signs BP 128/78 (BP Location: Right Arm)    Pulse 81    Temp 98 F (36.7 C) (Oral)    Resp 18    Ht 5\' 8"  (1.727 m)    Wt 99.8 kg    SpO2 100%    BMI 33.45 kg/m   Physical Exam Vitals signs and nursing note reviewed.  Constitutional:      General: She is not  in acute distress.    Appearance: She is well-developed. She is not ill-appearing, toxic-appearing or diaphoretic.  HENT:     Head: Atraumatic.     Nose: Nose normal.     Mouth/Throat:     Mouth: Mucous membranes are moist.     Pharynx: Oropharynx is clear.  Eyes:     Pupils: Pupils are equal, round, and reactive to light.  Neck:     Musculoskeletal: Normal range of motion.  Cardiovascular:     Rate and Rhythm: Normal rate.  Pulmonary:     Effort: No respiratory distress.  Abdominal:     General: There is no distension.  Musculoskeletal: Normal range of motion.     Comments: Tenderness palpation to metacarpals over digits 3, 4 to right lower extremity.  No tenderness over navicular, calcaneus, ankle, tibia/fibula.  Full range  of motion bilateral lower extremities without difficulty.  Compartments soft.  Ambulatory without difficulty.  No swelling, deformity or injury.  Skin:    General: Skin is warm and dry.     Comments: No Lacerations, contusions or abrasions.  Brisk capillary refill.  No rash or lesions.  No edema, erythema, ecchymosis or warmth.  Neurological:     Mental Status: She is alert.     Comments: 5/5 bilateral lower extremities without difficulty.  No weakness.  Intact sensation to sharp and dull.  Ambulatory in ED without difficulty.    ED Treatments / Results  Labs (all labs ordered are listed, but only abnormal results are displayed) Labs Reviewed - No data to display  EKG None  Radiology Dg Foot Complete Right  Result Date: 12/14/2018 CLINICAL DATA:  Pain after kicking door EXAM: RIGHT FOOT COMPLETE - 3+ VIEW COMPARISON:  None. FINDINGS: Frontal, oblique, and lateral views were obtained. There is no fracture or dislocation. Joint spaces appear normal. No erosive change. IMPRESSION: No fracture or dislocation.  No evident arthropathy. Electronically Signed   By: Bretta BangWilliam  Woodruff III M.D.   On: 12/14/2018 13:17    Procedures Procedures (including critical care time)  Medications Ordered in ED Medications - No data to display   Initial Impression / Assessment and Plan / ED Course  I have reviewed the triage vital signs and the nursing notes.  Pertinent labs & imaging results that were available during my care of the patient were reviewed by me and considered in my medical decision making (see chart for details).  28 year old female appears otherwise well presents for evaluation of right foot pain.  Afebrile, nonseptic, non-ill-appearing.  Patient stubbed toes on heavy dresser approximately 2-3 days ago.  Patient with tenderness palpation over digits 3, 4 to right lower extremity.  No obvious location or deformity. No bony tenderness.  No lacerations, contusions, edema, erythema,  ecchymosis or warmth.  Normal musculoskeletal exam.  Neurovascularly intact.  Patient without any tenderness to foot, ankle, tibia/fibula.  Homans sign negative.  Compartments soft.  No evidence of infectious process on exam.  Heart and lungs clear.  Patient ambulatory in ED without difficulty.  X-ray negative for acute fracture, dislocation, effusion.  Likely sprain/sprain.  Discussed RICE for symptomatic management.  Patient to follow-up with PCP/orthopedics if having continued symptoms. No evidence of gout, septic joint, hemarthrosis, open fracture, dislocation, DVT, infection.  The patient has been appropriately medically screened and/or stabilized in the ED. I have low suspicion for any other emergent medical condition which would require further screening, evaluation or treatment in the ED or require inpatient management.  Patient is  hemodynamically stable and in no acute distress.  Patient able to ambulate in department prior to ED.  Evaluation does not show acute pathology that would require ongoing or additional emergent interventions while in the emergency department or further inpatient treatment.  I have discussed the diagnosis with the patient and answered all questions.  Patient has no further complaints prior to discharge.  Patient is comfortable with plan discussed in room and is stable for discharge at this time.  I have discussed strict return precautions for returning to the emergency department.  Patient was encouraged to follow-up with PCP/specialist refer to at discharge.     Final Clinical Impressions(s) / ED Diagnoses   Final diagnoses:  Right foot pain    ED Discharge Orders    None       Harce Volden A, PA-C 12/14/18 1404    Little, Ambrose Finlandachel Morgan, MD 12/15/18 1240
